# Patient Record
Sex: Male | Born: 1985 | Race: Black or African American | Hispanic: No | Marital: Single | Smoking: Current every day smoker
Health system: Southern US, Community
[De-identification: ages and names within clinical notes are randomized; demographics above are authoritative.]

---

## 2013-07-06 ENCOUNTER — Emergency Department (HOSPITAL_BASED_OUTPATIENT_CLINIC_OR_DEPARTMENT_OTHER): Payer: Self-pay

## 2013-07-06 ENCOUNTER — Emergency Department (HOSPITAL_BASED_OUTPATIENT_CLINIC_OR_DEPARTMENT_OTHER)
Admission: EM | Admit: 2013-07-06 | Discharge: 2013-07-07 | Disposition: A | Discharge status: Home / Self Care | Payer: Self-pay | Attending: Emergency Medicine | Admitting: Emergency Medicine

## 2013-07-06 ENCOUNTER — Encounter (HOSPITAL_BASED_OUTPATIENT_CLINIC_OR_DEPARTMENT_OTHER): Payer: Self-pay | Admitting: Emergency Medicine

## 2013-07-06 DIAGNOSIS — Z880 Allergy status to penicillin: Secondary | ICD-10-CM | POA: Insufficient documentation

## 2013-07-06 DIAGNOSIS — F17200 Nicotine dependence, unspecified, uncomplicated: Secondary | ICD-10-CM | POA: Insufficient documentation

## 2013-07-06 DIAGNOSIS — R1013 Epigastric pain: Secondary | ICD-10-CM | POA: Insufficient documentation

## 2013-07-06 DIAGNOSIS — R079 Chest pain, unspecified: Secondary | ICD-10-CM | POA: Insufficient documentation

## 2013-07-06 LAB — CBC WITH DIFFERENTIAL/PLATELET
BASOS ABS: 0 10*3/uL (ref 0.0–0.1)
Basophils Relative: 0 % (ref 0–1)
EOS ABS: 0.2 10*3/uL (ref 0.0–0.7)
EOS PCT: 1 % (ref 0–5)
HCT: 47.5 % (ref 39.0–52.0)
Hemoglobin: 16.5 g/dL (ref 13.0–17.0)
Lymphocytes Relative: 12 % (ref 12–46)
Lymphs Abs: 1.5 10*3/uL (ref 0.7–4.0)
MCH: 33.1 pg (ref 26.0–34.0)
MCHC: 34.7 g/dL (ref 30.0–36.0)
MCV: 95.2 fL (ref 78.0–100.0)
MONO ABS: 0.3 10*3/uL (ref 0.1–1.0)
Monocytes Relative: 3 % (ref 3–12)
Neutro Abs: 10.1 10*3/uL — ABNORMAL HIGH (ref 1.7–7.7)
Neutrophils Relative %: 83 % — ABNORMAL HIGH (ref 43–77)
Platelets: 219 10*3/uL (ref 150–400)
RBC: 4.99 MIL/uL (ref 4.22–5.81)
RDW: 12.2 % (ref 11.5–15.5)
WBC: 12.1 10*3/uL — ABNORMAL HIGH (ref 4.0–10.5)

## 2013-07-06 LAB — COMPREHENSIVE METABOLIC PANEL
ALT: 20 U/L (ref 0–53)
AST: 33 U/L (ref 0–37)
Albumin: 4.7 g/dL (ref 3.5–5.2)
Alkaline Phosphatase: 78 U/L (ref 39–117)
BUN: 9 mg/dL (ref 6–23)
CALCIUM: 10 mg/dL (ref 8.4–10.5)
CO2: 29 mEq/L (ref 19–32)
CREATININE: 1 mg/dL (ref 0.50–1.35)
Chloride: 99 mEq/L (ref 96–112)
GFR calc non Af Amer: >90 mL/min (ref >90)
GLUCOSE: 97 mg/dL (ref 70–99)
Potassium: 4 mEq/L (ref 3.7–5.3)
Sodium: 141 mEq/L (ref 137–147)
TOTAL PROTEIN: 8.6 g/dL — AB (ref 6.0–8.3)
Total Bilirubin: 0.7 mg/dL (ref 0.3–1.2)

## 2013-07-06 LAB — TROPONIN I: Troponin I: <0.3 ng/mL (ref <0.30)

## 2013-07-06 LAB — LIPASE, BLOOD: Lipase: 16 U/L (ref 11–59)

## 2013-07-06 MED ORDER — IOHEXOL 350 MG/ML SOLN
80.0000 mL | Freq: Once | INTRAVENOUS | Status: AC | PRN
  Administered 2013-07-06: 80 mL via INTRAVENOUS

## 2013-07-06 MED ORDER — ONDANSETRON HCL 4 MG/2ML IJ SOLN
4.0000 mg | Freq: Once | INTRAMUSCULAR | Status: AC
  Administered 2013-07-06: 4 mg via INTRAVENOUS
  Filled 2013-07-06: qty 2

## 2013-07-06 MED ORDER — GI COCKTAIL ~~LOC~~
30.0000 mL | Freq: Once | ORAL | Status: AC
  Administered 2013-07-06: 30 mL via ORAL
  Filled 2013-07-06: qty 30

## 2013-07-06 MED ORDER — OXYCODONE-ACETAMINOPHEN 5-325 MG PO TABS: 2.0000 | ORAL_TABLET | ORAL | Status: DC | PRN

## 2013-07-06 MED ORDER — HYDROMORPHONE HCL PF 1 MG/ML IJ SOLN
0.5000 mg | Freq: Once | INTRAMUSCULAR | Status: AC
  Administered 2013-07-06: 0.5 mg via INTRAVENOUS
  Filled 2013-07-06: qty 1

## 2013-07-06 MED ORDER — MORPHINE SULFATE 4 MG/ML IJ SOLN
4.0000 mg | Freq: Once | INTRAMUSCULAR | Status: AC
  Administered 2013-07-06: 4 mg via INTRAVENOUS
  Filled 2013-07-06: qty 1

## 2013-07-06 MED ORDER — SODIUM CHLORIDE 0.9 % IV BOLUS (SEPSIS)
1000.0000 mL | Freq: Once | INTRAVENOUS | Status: AC
  Administered 2013-07-06: 1000 mL via INTRAVENOUS

## 2013-07-06 MED ORDER — NAPROXEN 375 MG PO TABS: 375.0000 mg | ORAL_TABLET | Freq: Two times a day (BID) | ORAL | Status: DC

## 2013-07-06 NOTE — ED Notes (Addendum)
Pain under his ribs right and left x 2 hours. Pain is causing him to feel sob.

## 2013-07-06 NOTE — ED Notes (Signed)
Pt laying on stretcher talking on cell phone in NAD. When questioned about his pain, pt states "it's bad, real bad."

## 2013-07-06 NOTE — Discharge Instructions (Signed)
Chest Pain (Nonspecific) °It is often hard to give a specific diagnosis for the cause of chest pain. There is always a chance that your pain could be related to something serious, such as a heart attack or a blood clot in the lungs. You need to follow up with your caregiver for further evaluation. °CAUSES  °· Heartburn. °· Pneumonia or bronchitis. °· Anxiety or stress. °· Inflammation around your heart (pericarditis) or lung (pleuritis or pleurisy). °· A blood clot in the lung. °· A collapsed lung (pneumothorax). It can develop suddenly on its own (spontaneous pneumothorax) or from injury (trauma) to the chest. °· Shingles infection (herpes zoster virus). °The chest wall is composed of bones, muscles, and cartilage. Any of these can be the source of the pain. °· The bones can be bruised by injury. °· The muscles or cartilage can be strained by coughing or overwork. °· The cartilage can be affected by inflammation and become sore (costochondritis). °DIAGNOSIS  °Lab tests or other studies, such as X-rays, electrocardiography, stress testing, or cardiac imaging, may be needed to find the cause of your pain.  °TREATMENT  °· Treatment depends on what may be causing your chest pain. Treatment may include: °· Acid blockers for heartburn. °· Anti-inflammatory medicine. °· Pain medicine for inflammatory conditions. °· Antibiotics if an infection is present. °· You may be advised to change lifestyle habits. This includes stopping smoking and avoiding alcohol, caffeine, and chocolate. °· You may be advised to keep your head raised (elevated) when sleeping. This reduces the chance of acid going backward from your stomach into your esophagus. °· Most of the time, nonspecific chest pain will improve within 2 to 3 days with rest and mild pain medicine. °HOME CARE INSTRUCTIONS  °· If antibiotics were prescribed, take your antibiotics as directed. Finish them even if you start to feel better. °· For the next few days, avoid physical  activities that bring on chest pain. Continue physical activities as directed. °· Do not smoke. °· Avoid drinking alcohol. °· Only take over-the-counter or prescription medicine for pain, discomfort, or fever as directed by your caregiver. °· Follow your caregiver's suggestions for further testing if your chest pain does not go away. °· Keep any follow-up appointments you made. If you do not go to an appointment, you could develop lasting (chronic) problems with pain. If there is any problem keeping an appointment, you must call to reschedule. °SEEK MEDICAL CARE IF:  °· You think you are having problems from the medicine you are taking. Read your medicine instructions carefully. °· Your chest pain does not go away, even after treatment. °· You develop a rash with blisters on your chest. °SEEK IMMEDIATE MEDICAL CARE IF:  °· You have increased chest pain or pain that spreads to your arm, neck, jaw, back, or abdomen. °· You develop shortness of breath, an increasing cough, or you are coughing up blood. °· You have severe back or abdominal pain, feel nauseous, or vomit. °· You develop severe weakness, fainting, or chills. °· You have a fever. °THIS IS AN EMERGENCY. Do not wait to see if the pain will go away. Get medical help at once. Call your local emergency services (911 in [place] Do not drive yourself to the hospital. °MAKE SURE YOU:  °· Understand these instructions. °· Will watch your condition. °· Will get help right away if you are not doing well or get worse. °Document Released: 04/01/2005 Document Revised: 09/14/2011 Document Reviewed: 01/26/2008 °[employer]® Patient Information ©2014 [employer]. ° °

## 2013-07-06 NOTE — ED Provider Notes (Addendum)
CSN: [id number]     Arrival date & time 07/06/13  2057 History   First MD Initiated Contact with Patient 07/06/13 2114     Chief Complaint  Patient presents with  . Shortness of Breath   (Consider location/radiation/quality/duration/timing/severity/associated sxs/prior Treatment) HPI Comments: Patient presents with chest pain. He states that he was sitting on the couch watching TV this evening when he had a sudden onset of a sharp pain in the Center of his chest. He also has some associated shortness of breath he feels more that it is related to the pain rather than actually being short of breath. He says the pain is not related to breathing. It is worse when he lays back and it's better when he leans forward. He denies any recent cough or chest congestion. He denies any nausea or vomiting. Denies any dizziness. He denies any past history of heart problems. He is a smoker. He denies any recent alcohol or drug use.  Patient is a 28 y.o. male presenting with shortness of breath.  Shortness of Breath Associated symptoms: chest pain   Associated symptoms: no abdominal pain, no cough, no diaphoresis, no fever, no headaches, no rash and no vomiting     History reviewed. No pertinent past medical history. History reviewed. No pertinent past surgical history. No family history on file. History  Substance Use Topics  . Smoking status: Current Every Day Smoker -- 1.00 packs/day    Types: Cigarettes  . Smokeless tobacco: Not on file  . Alcohol Use: Yes    Review of Systems  Constitutional: Negative for fever, chills, diaphoresis and fatigue.  HENT: Negative for congestion, rhinorrhea and sneezing.   Eyes: Negative.   Respiratory: Positive for shortness of breath. Negative for cough and chest tightness.   Cardiovascular: Positive for chest pain. Negative for leg swelling.  Gastrointestinal: Negative for nausea, vomiting, abdominal pain, diarrhea and blood in stool.  Genitourinary: Negative for  frequency, hematuria, flank pain and difficulty urinating.  Musculoskeletal: Negative for arthralgias and back pain.  Skin: Negative for rash.  Neurological: Negative for dizziness, speech difficulty, weakness, numbness and headaches.    Allergies  Penicillins  Home Medications   Current Outpatient Rx  Name  Route  Sig  Dispense  Refill  . naproxen (NAPROSYN) 375 MG tablet   Oral   Take 1 tablet (375 mg total) by mouth 2 (two) times daily.   20 tablet   0   . oxyCODONE-acetaminophen (PERCOCET) 5-325 MG per tablet   Oral   Take 2 tablets by mouth every 4 (four) hours as needed.   15 tablet   0    BP 116/88  Pulse 80  Temp(Src) 98 F (36.7 C) (Oral)  Resp 20  Ht 6' (1.829 m)  Wt 145 lb (65.772 kg)  BMI 19.66 kg/m2  SpO2 100% Physical Exam  Constitutional: He is oriented to person, place, and time. He appears well-developed and well-nourished.  HENT:  Head: Normocephalic and atraumatic.  Mouth/Throat: Oropharynx is clear and moist.  Eyes: Pupils are equal, round, and reactive to light.  Neck: Normal range of motion. Neck supple.  Cardiovascular: Normal rate, regular rhythm and normal heart sounds.   Pulmonary/Chest: Effort normal and breath sounds normal. No respiratory distress. He has no wheezes. He has no rales. He exhibits tenderness (Patient is markedly tender on palpation of his lower sternum and over the epigastrium).  Abdominal: Soft. Bowel sounds are normal. There is no tenderness. There is no rebound and no guarding.  Musculoskeletal: Normal range of motion. He exhibits no edema.  Lymphadenopathy:    He has no cervical adenopathy.  Neurological: He is alert and oriented to person, place, and time.  Skin: Skin is warm and dry. No rash noted.  Psychiatric: He has a normal mood and affect.    ED Course  Procedures (including critical care time) Labs Review Results for orders placed during the hospital encounter of 07/06/13  CBC WITH DIFFERENTIAL       Result Value Range   WBC 12.1 (*) 4.0 - 10.5 K/uL   RBC 4.99  4.22 - 5.81 MIL/uL   Hemoglobin 16.5  13.0 - 17.0 g/dL   HCT 16.1  09.6 - 04.5 %   MCV 95.2  78.0 - 100.0 fL   MCH 33.1  26.0 - 34.0 pg   MCHC 34.7  30.0 - 36.0 g/dL   RDW 40.9  81.1 - 91.4 %   Platelets 219  150 - 400 K/uL   Neutrophils Relative % 83 (*) 43 - 77 %   Neutro Abs 10.1 (*) 1.7 - 7.7 K/uL   Lymphocytes Relative 12  12 - 46 %   Lymphs Abs 1.5  0.7 - 4.0 K/uL   Monocytes Relative 3  3 - 12 %   Monocytes Absolute 0.3  0.1 - 1.0 K/uL   Eosinophils Relative 1  0 - 5 %   Eosinophils Absolute 0.2  0.0 - 0.7 K/uL   Basophils Relative 0  0 - 1 %   Basophils Absolute 0.0  0.0 - 0.1 K/uL  COMPREHENSIVE METABOLIC PANEL      Result Value Range   Sodium 141  137 - 147 mEq/L   Potassium 4.0  3.7 - 5.3 mEq/L   Chloride 99  96 - 112 mEq/L   CO2 29  19 - 32 mEq/L   Glucose, Bld 97  70 - 99 mg/dL   BUN 9  6 - 23 mg/dL   Creatinine, Ser 7.82  0.50 - 1.35 mg/dL   Calcium 95.6  8.4 - 21.3 mg/dL   Total Protein 8.6 (*) 6.0 - 8.3 g/dL   Albumin 4.7  3.5 - 5.2 g/dL   AST 33  0 - 37 U/L   ALT 20  0 - 53 U/L   Alkaline Phosphatase 78  39 - 117 U/L   Total Bilirubin 0.7  0.3 - 1.2 mg/dL   GFR calc non Af Amer >90  >90 mL/min   GFR calc Af Amer >90  >90 mL/min  LIPASE, BLOOD      Result Value Range   Lipase 16  11 - 59 U/L  TROPONIN I      Result Value Range   Troponin I <0.30  <0.30 ng/mL   Dg Chest 2 View  07/06/2013   CLINICAL DATA:  Chest pain, shortness of breath  EXAM: CHEST  2 VIEW  COMPARISON:  08/09/2007  FINDINGS: The heart size and vascular pattern are normal. There are no pleural effusions. The lungs are clear.  IMPRESSION: No active cardiopulmonary disease.   Electronically Signed   By: Esperanza Heir M.D.   On: 07/06/2013 22:05   Ct Angio Chest Pe W/cm &/or Wo Cm  07/06/2013   CLINICAL DATA:  Right-sided chest pain for the past several hours. Shortness of breath.  EXAM: CT ANGIOGRAPHY CHEST WITH CONTRAST   TECHNIQUE: Multidetector CT imaging of the chest was performed using the standard protocol during bolus administration of intravenous contrast. Multiplanar CT image reconstructions  including MIPs were obtained to evaluate the vascular anatomy.  CONTRAST:  80mL OMNIPAQUE IOHEXOL 350 MG/ML SOLN  COMPARISON:  No priors.  FINDINGS: Mediastinum: There are no filling defects within the pulmonary arterial tree to suggest underlying pulmonary embolism. Heart size is normal. There is no significant pericardial fluid, thickening or pericardial calcification. No pathologically enlarged mediastinal or hilar lymph nodes. Esophagus is unremarkable in appearance.  Lungs/Pleura: No pneumothorax. No acute consolidative airspace disease. No pleural effusions. No suspicious appearing pulmonary nodules or masses.  Upper Abdomen: Unremarkable.  Musculoskeletal: There are no aggressive appearing lytic or blastic lesions noted in the visualized portions of the skeleton.  Review of the MIP images confirms the above findings.  IMPRESSION: 1. No evidence of pulmonary embolism. 2. No acute findings in the thorax to account for the patient's symptoms.   Electronically Signed   By: Trudie Reed M.D.   On: 07/06/2013 23:12     Imaging Review Dg Chest 2 View  07/06/2013   CLINICAL DATA:  Chest pain, shortness of breath  EXAM: CHEST  2 VIEW  COMPARISON:  08/09/2007  FINDINGS: The heart size and vascular pattern are normal. There are no pleural effusions. The lungs are clear.  IMPRESSION: No active cardiopulmonary disease.   Electronically Signed   By: Esperanza Heir M.D.   On: 07/06/2013 22:05   Ct Angio Chest Pe W/cm &/or Wo Cm  07/06/2013   CLINICAL DATA:  Right-sided chest pain for the past several hours. Shortness of breath.  EXAM: CT ANGIOGRAPHY CHEST WITH CONTRAST  TECHNIQUE: Multidetector CT imaging of the chest was performed using the standard protocol during bolus administration of intravenous contrast. Multiplanar CT image  reconstructions including MIPs were obtained to evaluate the vascular anatomy.  CONTRAST:  80mL OMNIPAQUE IOHEXOL 350 MG/ML SOLN  COMPARISON:  No priors.  FINDINGS: Mediastinum: There are no filling defects within the pulmonary arterial tree to suggest underlying pulmonary embolism. Heart size is normal. There is no significant pericardial fluid, thickening or pericardial calcification. No pathologically enlarged mediastinal or hilar lymph nodes. Esophagus is unremarkable in appearance.  Lungs/Pleura: No pneumothorax. No acute consolidative airspace disease. No pleural effusions. No suspicious appearing pulmonary nodules or masses.  Upper Abdomen: Unremarkable.  Musculoskeletal: There are no aggressive appearing lytic or blastic lesions noted in the visualized portions of the skeleton.  Review of the MIP images confirms the above findings.  IMPRESSION: 1. No evidence of pulmonary embolism. 2. No acute findings in the thorax to account for the patient's symptoms.   Electronically Signed   By: Trudie Reed M.D.   On: 07/06/2013 23:12    EKG Interpretation    Date/Time:  Thursday July 06 2013 21:15:48 EST Ventricular Rate:  73 PR Interval:  134 QRS Duration: 92 QT Interval:  358 QTC Calculation: 394 R Axis:   85 Text Interpretation:  Normal sinus rhythm with sinus arrhythmia Possible Left atrial enlargement Early repolarization Borderline ECG Confirmed by Jasmin Trumbull  MD, Brody Bonneau ([id number]) on 07/07/2013 12:01:18 AM            MDM   1. Chest pain    Patient presents with sudden onset of chest pain. It is reproducible on palpation and it's worse is with laying flat. I don't hear any heart murmur. There is no signs of pericardial effusion. His EKG initially showed what looked to be ST elevation however if her to changes are more consistent with early repolarization. It appears fairly diffuse. A repeat EKG was done which doesn't show  any significant changes and looks to be more consistent with early  repolarization changes. The other possibilities he could have pericarditis however his chest pain was sudden in onset. However there is no murmur or pericardial effusion on CT. There is no evidence of pulmonary embolus. There is no evidence of pancreatitis. There is no evidence of pneumothorax. He was treated with pain medications in the ED. He's feeling a bit better after this. He has no hypoxia or abnormal vital signs. At this point I feel he can be discharged home with symptomatic treatment.  Will check 2nd troponin, if neg, Dr. Nicanor Alcon to discharge.    Rolan Bucco, MD 07/07/13 0000  Rolan Bucco, MD 07/07/13 1610  Rolan Bucco, MD 07/07/13 0005

## 2013-07-07 LAB — TROPONIN I

## 2013-07-07 MED ORDER — TRAMADOL HCL 50 MG PO TABS: 50.0000 mg | ORAL_TABLET | Freq: Four times a day (QID) | ORAL | Status: AC | PRN

## 2013-07-07 MED ORDER — KETOROLAC TROMETHAMINE 30 MG/ML IJ SOLN
30.0000 mg | Freq: Once | INTRAMUSCULAR | Status: AC
  Administered 2013-07-07: 30 mg via INTRAVENOUS
  Filled 2013-07-07: qty 1

## 2013-07-07 MED ORDER — NAPROXEN 375 MG PO TABS: 375.0000 mg | ORAL_TABLET | Freq: Two times a day (BID) | ORAL | Status: AC

## 2014-08-17 IMAGING — CT CT ANGIO CHEST
2 of 6 series · 19 of 36 positions shown · IV contrast (APPLIED)
Comparison: No priors.

CLINICAL DATA: Right-sided chest pain for the past several hours.
Shortness of breath.

EXAM:
CT ANGIOGRAPHY CHEST WITH CONTRAST
TECHNIQUE: Multidetector CT imaging of the chest was performed using the
standard protocol during bolus administration of intravenous
contrast. Multiplanar CT image reconstructions including MIPs were
obtained to evaluate the vascular anatomy.
CONTRAST:  80mL OMNIPAQUE IOHEXOL 350 MG/ML SOLN

[Series 5: pe 1.0 b26f · axial · 0.66mm/px · z∈[+1229,+1459]mm · 18 of 256 slices shown]
[im 13/256  lung]
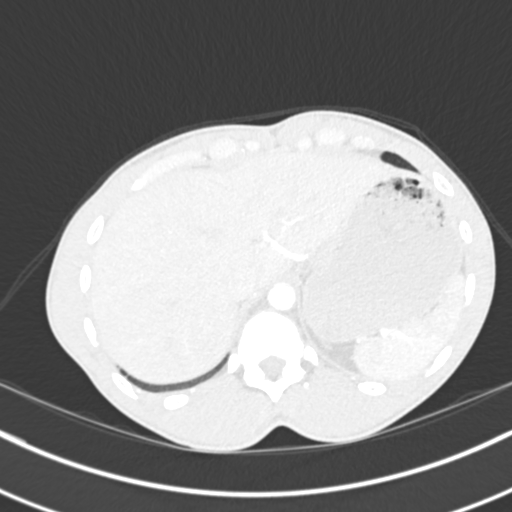
[im 26/256  mediastinal]
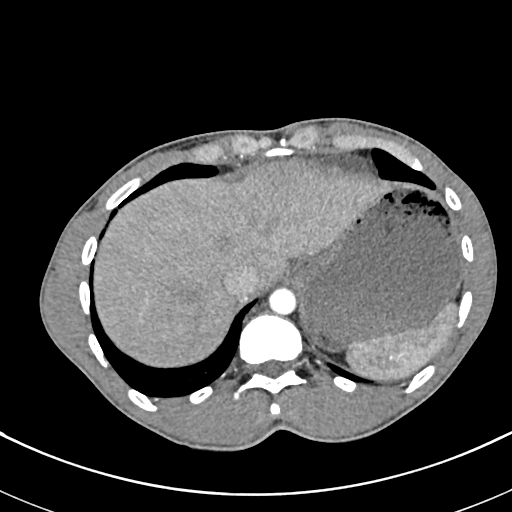
[im 39/256  lung]
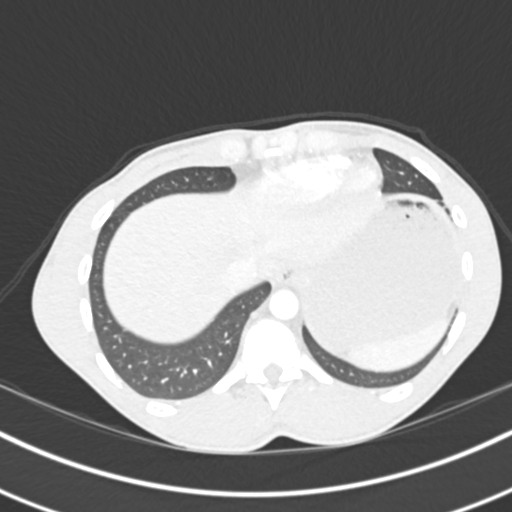
[im 52/256  mediastinal]
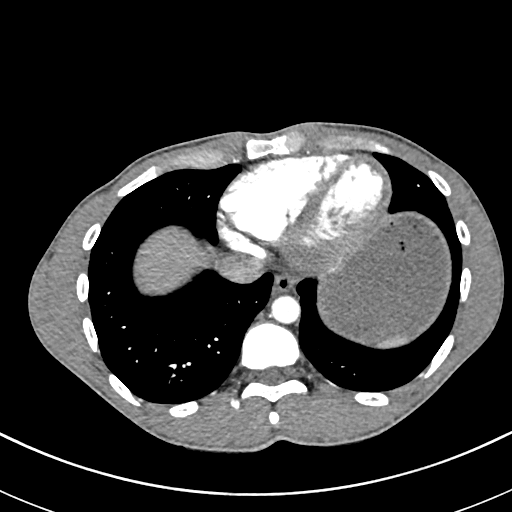
[im 64/256  lung]
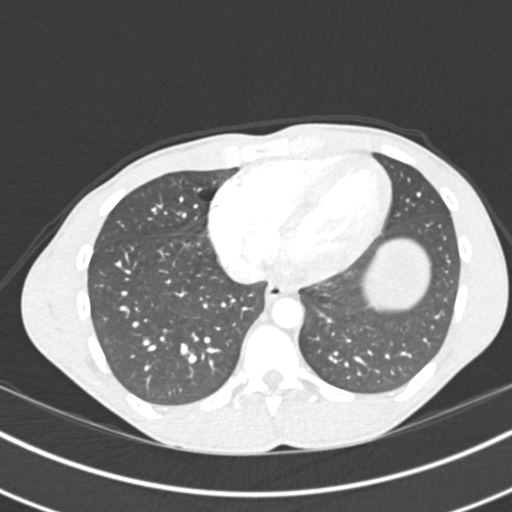
[im 77/256  mediastinal]
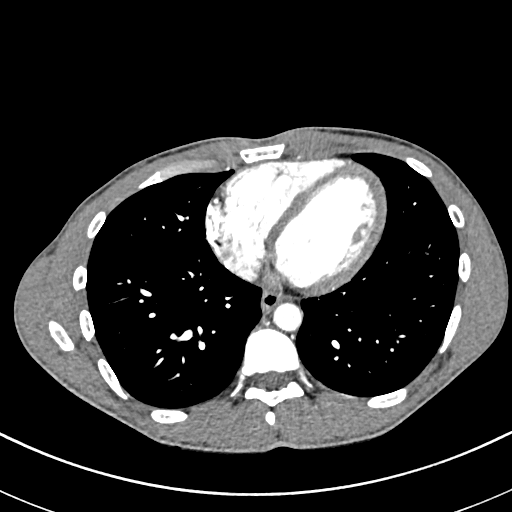
[im 90/256  lung]
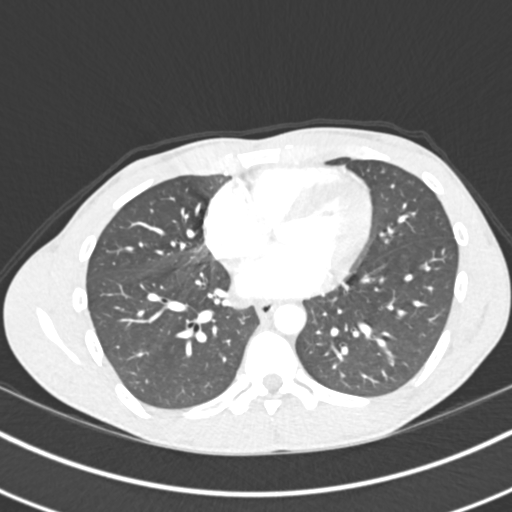
[im 103/256  mediastinal]
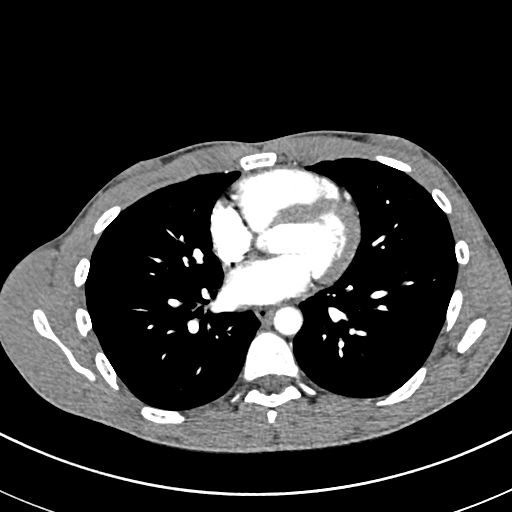
[im 115/256  lung]
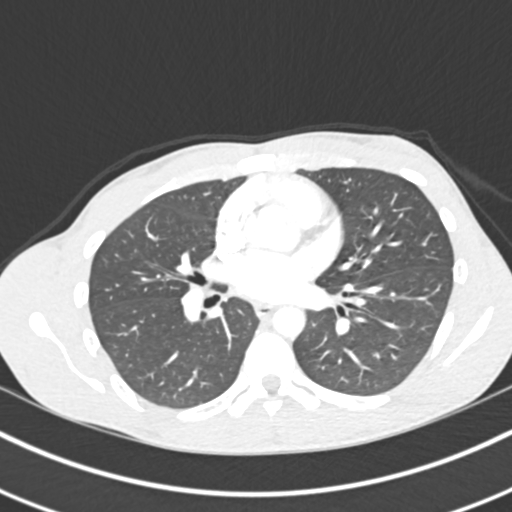
[im 141/256  mediastinal]
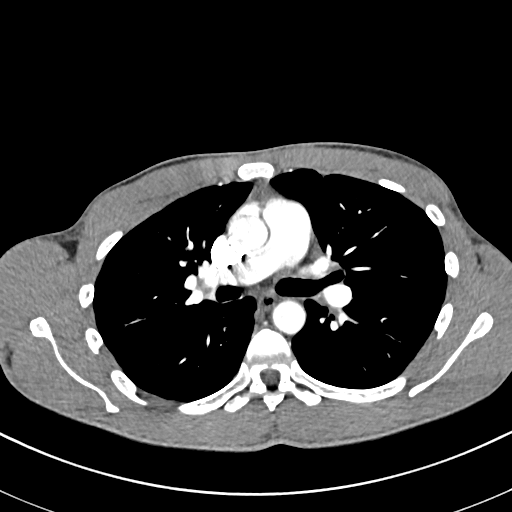
[im 154/256  lung]
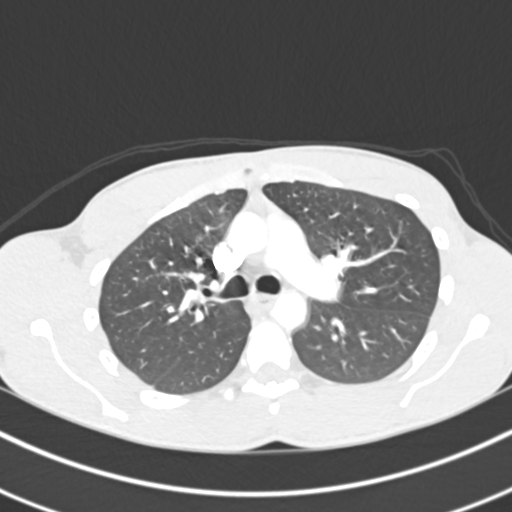
[im 166/256  mediastinal]
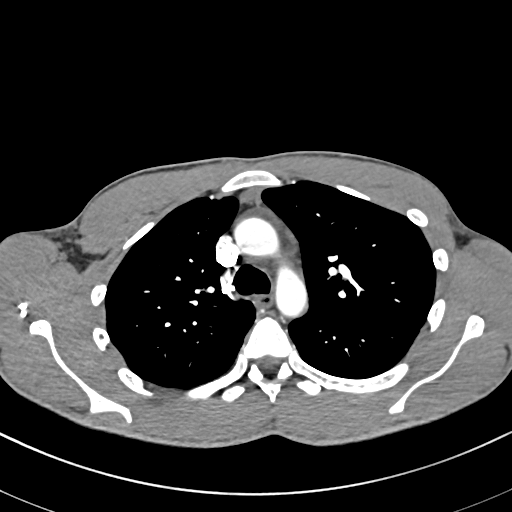
[im 179/256  lung]
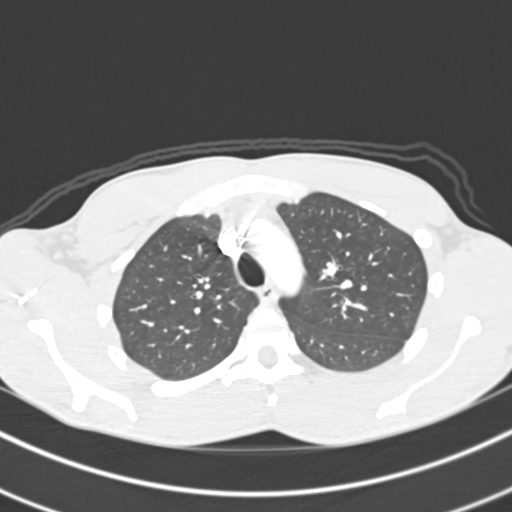
[im 192/256  mediastinal]
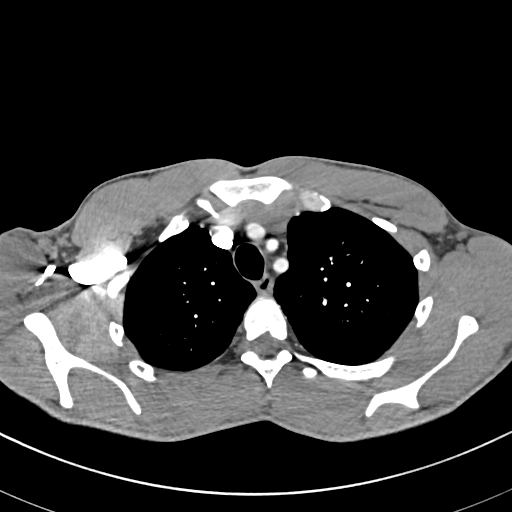
[im 205/256  lung]
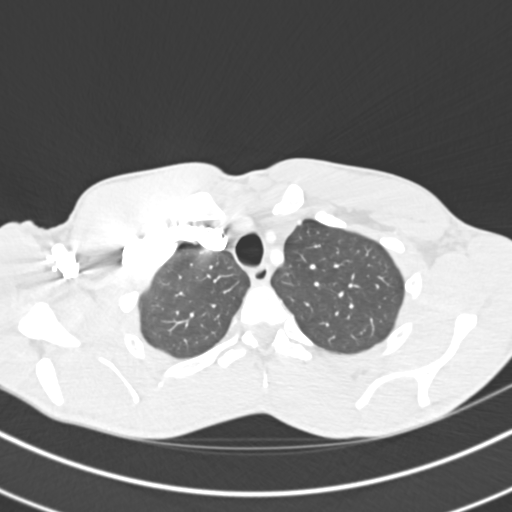
[im 217/256  mediastinal]
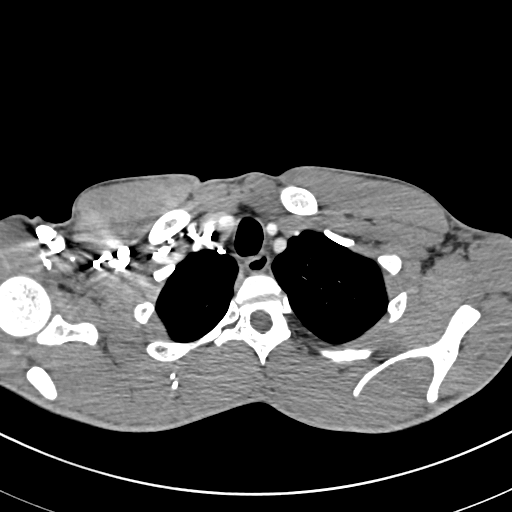
[im 230/256  lung]
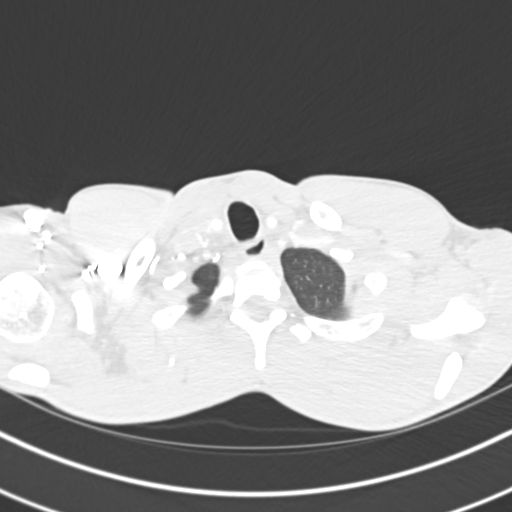
[im 243/256  mediastinal]
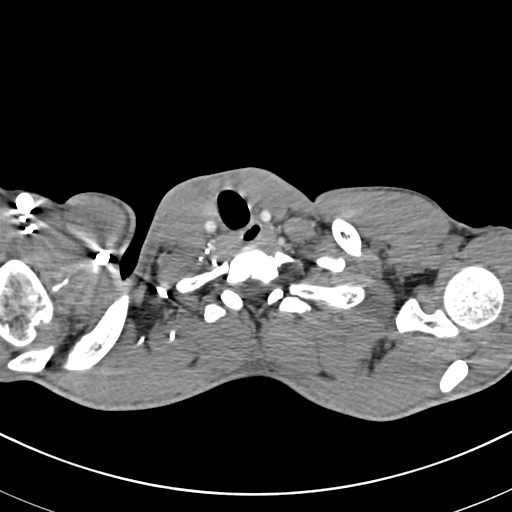

[Series 8: pe 2.0 coronal · coronal · 0.52mm/px · 1 of 106 slices shown]
[im 53/106  mediastinal]
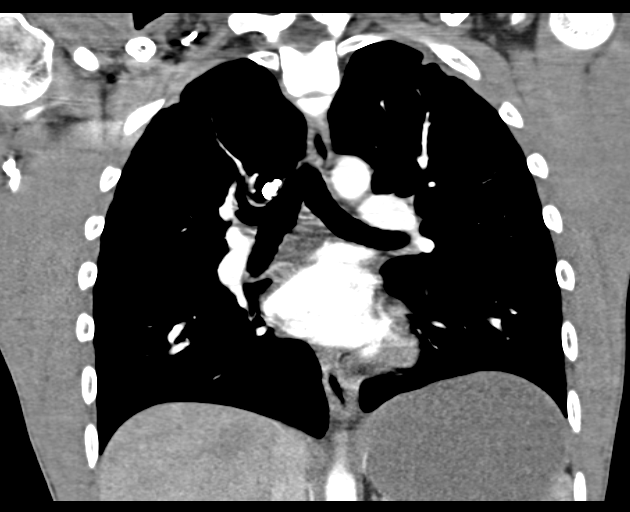

[19 of 36 positions shown; findings below may reference images not displayed]

FINDINGS: Mediastinum: There are no filling defects within the pulmonary
arterial tree to suggest underlying pulmonary embolism. Heart size
is normal. There is no significant pericardial fluid, thickening or
pericardial calcification. No pathologically enlarged mediastinal or
hilar lymph nodes. Esophagus is unremarkable in appearance.

Lungs/Pleura: No pneumothorax. No acute consolidative airspace
disease. No pleural effusions. No suspicious appearing pulmonary
nodules or masses.

Upper Abdomen: Unremarkable.

Musculoskeletal: There are no aggressive appearing lytic or blastic
lesions noted in the visualized portions of the skeleton.

Review of the MIP images confirms the above findings.
IMPRESSION: 1. No evidence of pulmonary embolism.
2. No acute findings in the thorax to account for the patient's
symptoms.

## 2014-08-17 IMAGING — CR DG CHEST 2V
2 series · 2 of 2 positions shown · non-contrast
Comparison: 08/09/2007

CLINICAL DATA: Chest pain, shortness of breath

EXAM:
CHEST  2 VIEW

[w chest pa]
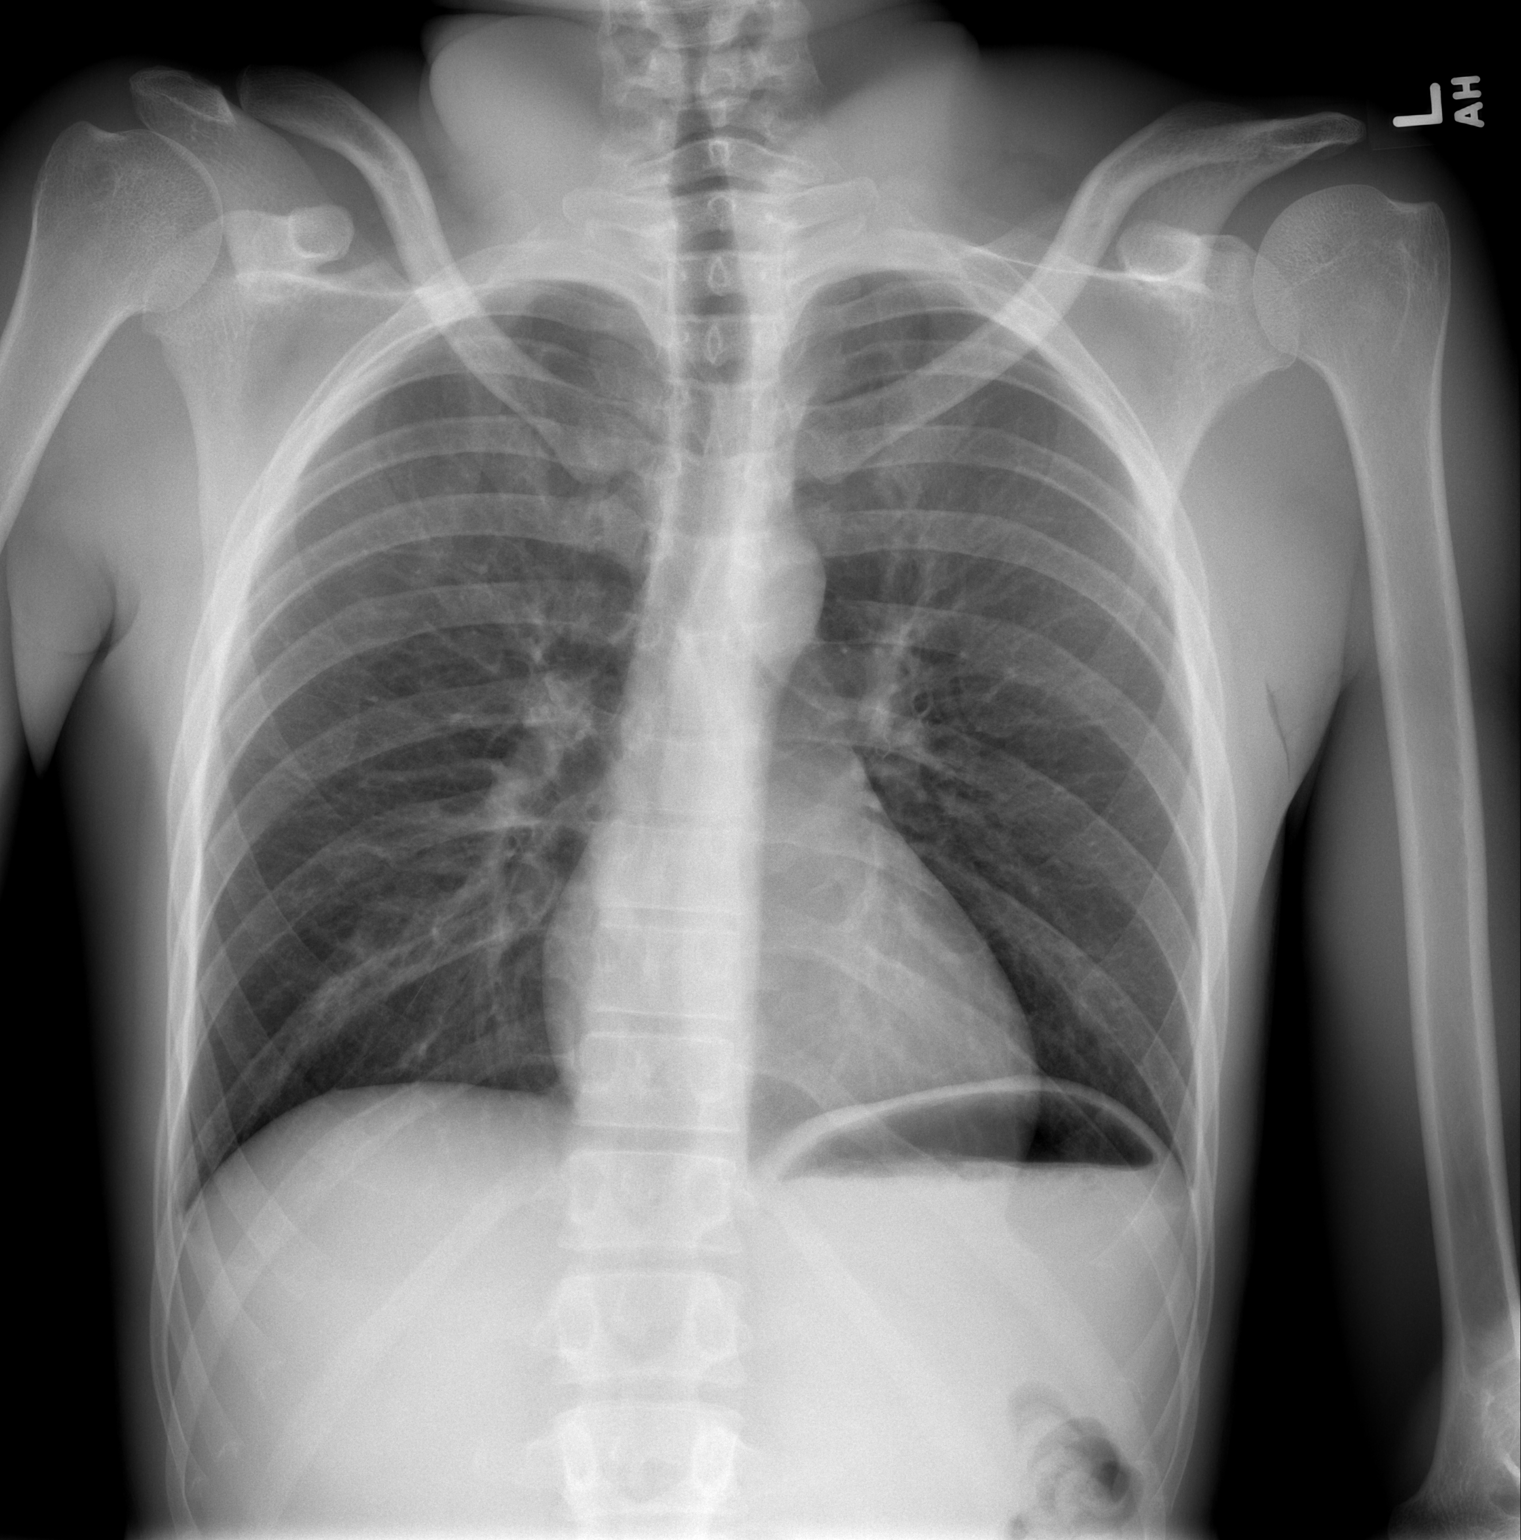

[w chest lat]
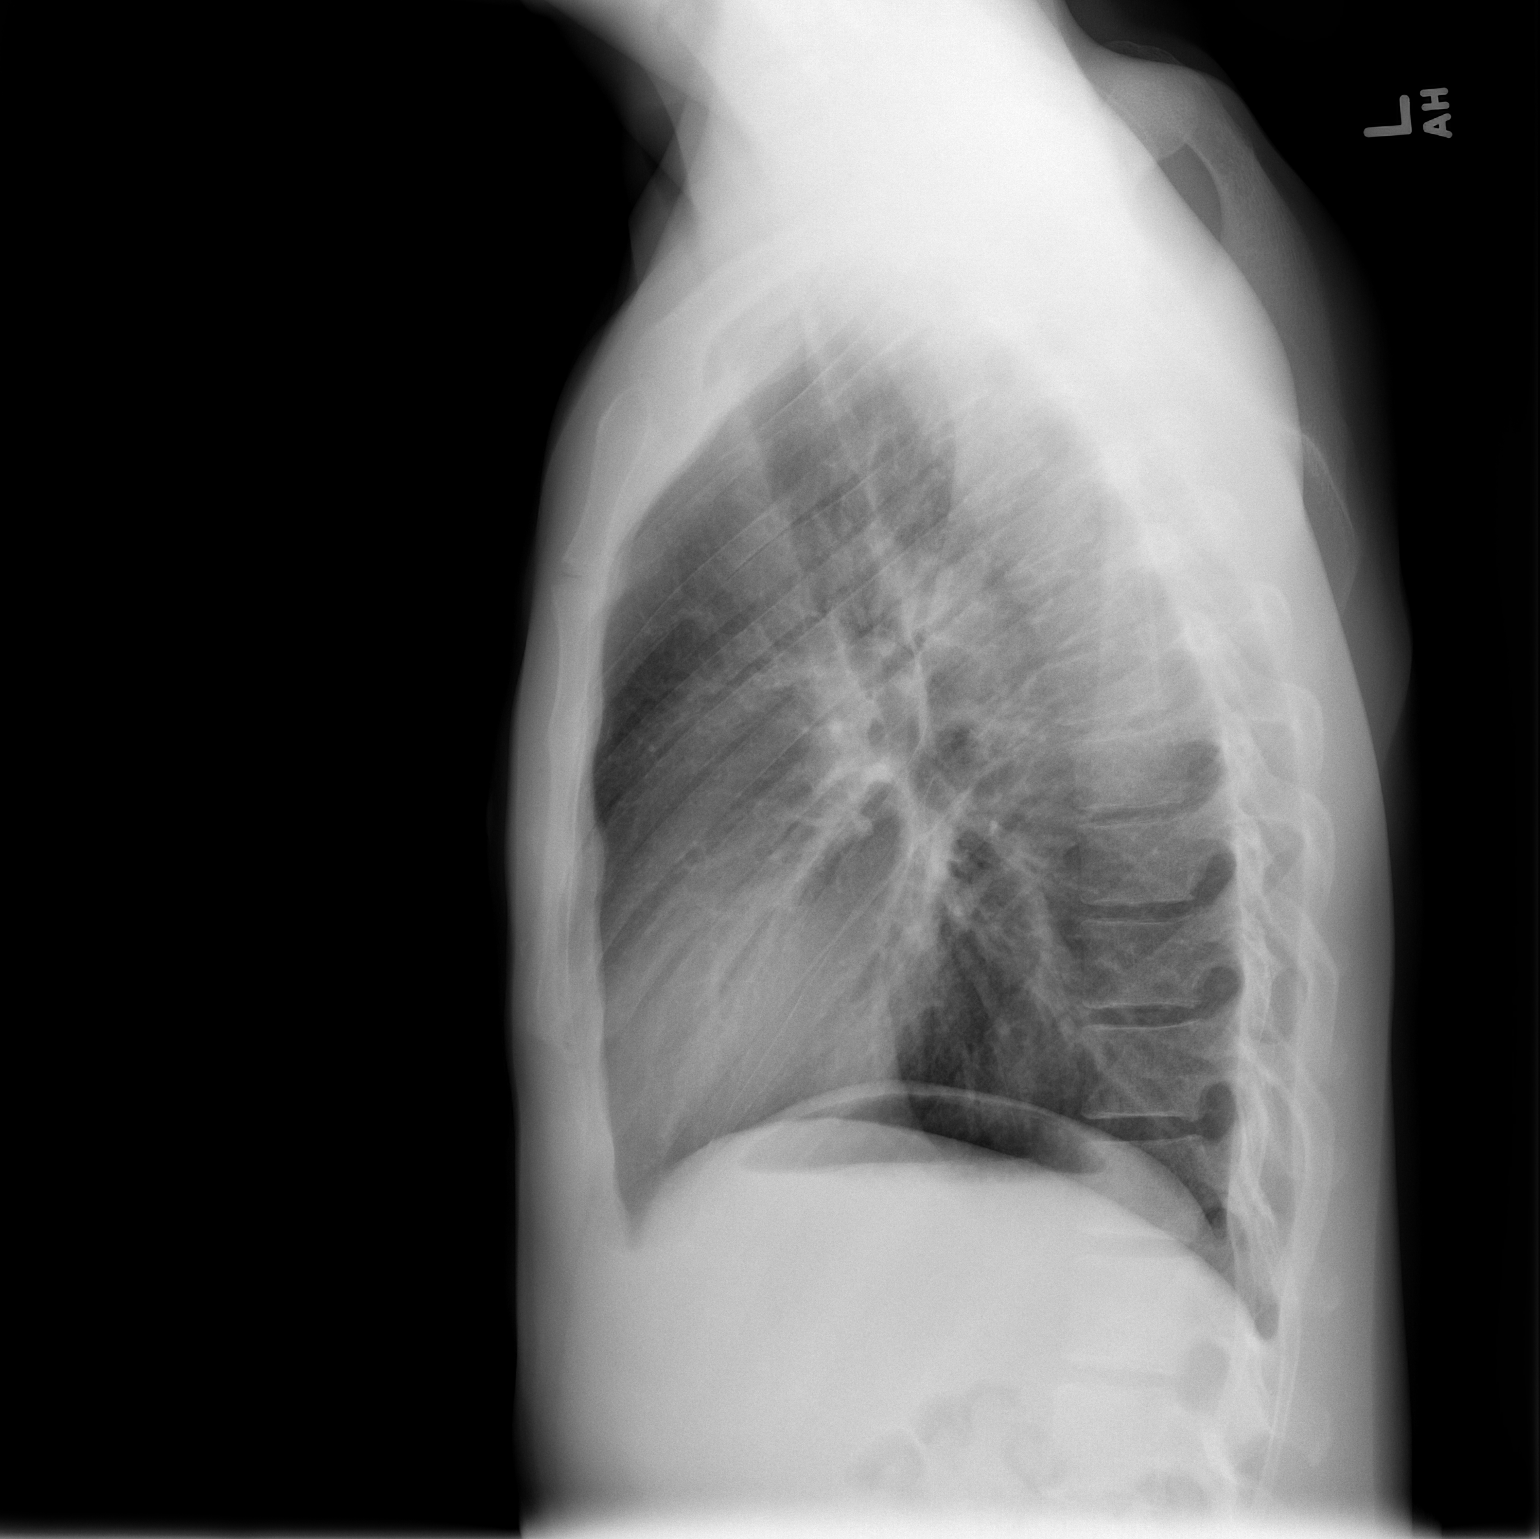

[2 of 2 positions shown; findings below may reference images not displayed]

FINDINGS: The heart size and vascular pattern are normal. There are no pleural
effusions. The lungs are clear.
IMPRESSION: No active cardiopulmonary disease.
